# Patient Record
Sex: Male | Born: 2000 | Race: Black or African American | Hispanic: No | Marital: Single | Smoking: Never smoker
Health system: Southern US, Community
[De-identification: ages and names within clinical notes are randomized; demographics above are authoritative.]

## PROBLEM LIST (undated history)

## (undated) HISTORY — PX: TYMPANOSTOMY TUBE PLACEMENT: SHX32

---
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
## 2023-07-08 ENCOUNTER — Ambulatory Visit: Payer: Medicaid Other | Admitting: Family Medicine

## 2023-09-16 ENCOUNTER — Ambulatory Visit: Payer: Self-pay

## 2023-09-17 ENCOUNTER — Ambulatory Visit
Admission: RE | Admit: 2023-09-17 | Discharge: 2023-09-17 | Disposition: A | Discharge status: Home / Self Care | Source: Ambulatory Visit | Attending: Nurse Practitioner

## 2023-09-17 VITALS — BP 130/83 | HR 86 | Temp 97.8°F | Resp 20

## 2023-09-17 DIAGNOSIS — H01001 Unspecified blepharitis right upper eyelid: Secondary | ICD-10-CM

## 2023-09-17 DIAGNOSIS — J302 Other seasonal allergic rhinitis: Secondary | ICD-10-CM

## 2023-09-17 MED ORDER — ERYTHROMYCIN 5 MG/GM OP OINT: TOPICAL_OINTMENT | OPHTHALMIC | 0 refills | Status: AC

## 2023-09-17 MED ORDER — CETIRIZINE HCL 10 MG PO TABS: 10.0000 mg | ORAL_TABLET | Freq: Every day | ORAL | 0 refills | Status: AC

## 2023-09-17 NOTE — ED Provider Notes (Signed)
 [facility]    CSN: [id number] Arrival date & time: 09/17/23  1827      History   Chief Complaint Chief Complaint  Patient presents with   Allergic Reaction    Allergies right eye swollen - Entered by patient   Medication Refill    HPI Ronald Mcguire is a 23 y.o. male.    Brysyn Brandenberger is a 23 year old male presents with a 1-day history of swelling and mild redness of the right upper eyelid. He reports mild soreness in the area but no significant pain. He denies eye redness, vision changes, drainage, or excessive tearing. He has no other current symptoms but does have a history of allergies and experienced runny nose, sneezing, and eye irritation a few weeks ago. He has been taking his prescribed Zyrtec for the past week. His mother reports that he needs a refill of this medication.  The following portions of the patient's history were reviewed and updated as appropriate: allergies, current medications, past family history, past medical history, past social history, past surgical history, and problem list.      History reviewed. No pertinent past medical history.  There are no active problems to display for this patient.   Past Surgical History:  Procedure Laterality Date   TYMPANOSTOMY TUBE PLACEMENT         Home Medications    Prior to Admission medications   Medication Sig Start Date End Date Taking? Authorizing Provider  erythromycin ophthalmic ointment Place a 1/2 inch ribbon of ointment into the lower eyelid every 4 hours while awake for 1 week (7 days) 09/17/23  Yes Lurline Idol, FNP  benzonatate (TESSALON) 100 MG capsule Take 1-2 capsules (100-200 mg total) by mouth 3 (three) times daily as needed for cough. 01/03/21   Wallis Bamberg, PA-C  cetirizine (ZYRTEC) 10 MG tablet Take 1 tablet (10 mg total) by mouth daily with breakfast. 09/17/23   Lurline Idol, FNP  naproxen (NAPROSYN) 375 MG tablet Take 1 tablet (375 mg total) by mouth 2 (two)  times daily with a meal. 01/03/21   Wallis Bamberg, PA-C  Pediatric Multiple Vit-C-FA (MULTIVITAMIN CHILDRENS PO) Take by mouth.    [provider]  promethazine-dextromethorphan (PROMETHAZINE-DM) 6.25-15 MG/5ML syrup Take 5 mLs by mouth at bedtime as needed for cough. 01/03/21   Wallis Bamberg, PA-C    Family History Family History  Problem Relation Age of Onset   Anxiety disorder Mother    ADD / ADHD Sister    Headache Brother    ADD / ADHD Brother    Seizures Other    Depression Neg Hx    Bipolar disorder Neg Hx    Schizophrenia Neg Hx    Autism Neg Hx     Social History Social History   Tobacco Use   Smoking status: Never   Smokeless tobacco: Never  Substance Use Topics   Alcohol use: Never   Drug use: Never     Allergies   Patient has no known allergies.   Review of Systems Review of Systems  Constitutional:  Negative for fever.  HENT:  Negative for congestion, rhinorrhea, sneezing and sore throat.   Eyes:  Negative for photophobia, pain, discharge, redness, itching and visual disturbance.  Gastrointestinal: Negative.   Musculoskeletal: Negative.   Neurological: Negative.   All other systems reviewed and are negative.    Physical Exam Triage Vital Signs ED Triage Vitals  Encounter Vitals Group     BP 09/17/23 1849 130/83  Systolic BP Percentile --      Diastolic BP Percentile --      Pulse Rate 09/17/23 1849 86     Resp 09/17/23 1849 20     Temp 09/17/23 1849 97.8 F (36.6 C)     Temp Source 09/17/23 1849 Oral     SpO2 09/17/23 1849 98 %     Weight --      Height --      Head Circumference --      Peak Flow --      Pain Score 09/17/23 1848 0     Pain Loc --      Pain Education --      Exclude from Growth Chart --    No data found.  Updated Vital Signs BP 130/83 (BP Location: Left Arm)   Pulse 86   Temp 97.8 F (36.6 C) (Oral)   Resp 20   SpO2 98%   Visual Acuity Right Eye Distance:   Left Eye Distance:   Bilateral Distance:     Right Eye Near:   Left Eye Near:    Bilateral Near:     Physical Exam Vitals reviewed.  Constitutional:      General: He is not in acute distress.    Appearance: Normal appearance. He is not toxic-appearing.  HENT:     Head: Normocephalic.     Mouth/Throat:     Mouth: Mucous membranes are moist.  Eyes:     General: Lids are normal. Lids are everted, no foreign bodies appreciated. Vision grossly intact. No visual field deficit.       Right eye: No foreign body, discharge or hordeolum.        Left eye: No foreign body, discharge or hordeolum.     Extraocular Movements: Extraocular movements intact.     Conjunctiva/sclera: Conjunctivae normal.     Pupils: Pupils are equal, round, and reactive to light.     Comments: Right upper eyelid swelling noted   Cardiovascular:     Rate and Rhythm: Normal rate and regular rhythm.     Heart sounds: Normal heart sounds.  Pulmonary:     Effort: Pulmonary effort is normal.     Breath sounds: Normal breath sounds.  Musculoskeletal:        General: Normal range of motion.  Skin:    General: Skin is warm and dry.  Neurological:     General: No focal deficit present.     Mental Status: He is alert and oriented to person, place, and time.      UC Treatments / Results  Labs (all labs ordered are listed, but only abnormal results are displayed) Labs Reviewed - No data to display  EKG   Radiology No results found.  Procedures Procedures (including critical care time)  Medications Ordered in UC Medications - No data to display  Initial Impression / Assessment and Plan / UC Course  I have reviewed the triage vital signs and the nursing notes.  Pertinent labs & imaging results that were available during my care of the patient were reviewed by me and considered in my medical decision making (see chart for details).    23 year old male presenting with right upper eyelid swelling for the past day. He has no current systemic symptoms  but has a history of allergies and recently experienced runny nose, sneezing, and eye irritation. He has been taking his prescribed Zyrtec for allergy management. Patient is alert, oriented, afebrile, and nontoxic. Physical exam is consistent  with blepharitis. Erythromycin ophthalmic ointment was prescribed. Supportive care measures were reviewed, and a refill of Zyrtec was provided.  Today's evaluation has revealed no signs of a dangerous process. Discussed diagnosis with patient and/or guardian. Patient and/or guardian aware of their diagnosis, possible red flag symptoms to watch out for and need for close follow up. Patient and/or guardian understands verbal and written discharge instructions. Patient and/or guardian comfortable with plan and disposition.  Patient and/or guardian has a clear mental status at this time, good insight into illness (after discussion and teaching) and has clear judgment to make decisions regarding their care  Documentation was completed with the aid of voice recognition software. Transcription may contain typographical errors. Final Clinical Impressions(s) / UC Diagnoses   Final diagnoses:  Blepharitis of right upper eyelid, unspecified type  Seasonal allergies     Discharge Instructions      You have been seen today for a condition called blepharitis, which is swelling and irritation of the eyelids. This condition can be uncomfortable but is treatable with regular care. Start by washing your eyes twice a day using a mixture of warm water and a small amount of baby shampoo. Make sure to wash your hands before touching your eyes, and use a clean towel to gently dry your eyelids afterward. Use the prescribed eye ointment as directed. To apply it, gently pull down your lower eyelid to create a small pocket, then squeeze a thin strip of ointment into that space without letting the tube touch your eye. Blink slowly a few times to help spread the medicine. It's normal for  your vision to be blurry for a short time after applying the ointment. Avoid rubbing your eyes, and do not wear makeup until your symptoms have fully resolved. You can also use a warm compress on your eyes for 5 to 10 minutes at a time, several times a day. This can help reduce discomfort and loosen any crusting. Be sure to do the warm compress before applying the ointment, or wait at least two hours after applying it. If you don't see improvement in your symptoms, follow up with your eye doctor or primary care provider for further evaluation.     ED Prescriptions     Medication Sig Dispense Auth. Provider   cetirizine (ZYRTEC) 10 MG tablet Take 1 tablet (10 mg total) by mouth daily with breakfast. 30 tablet Nerine Pulse, FNP   erythromycin ophthalmic ointment Place a 1/2 inch ribbon of ointment into the lower eyelid every 4 hours while awake for 1 week (7 days) 3.5 g Maryruth Sol, FNP      PDMP not reviewed this encounter.   Maryruth Sol, [place] 09/17/23 [id number]

## 2023-09-17 NOTE — ED Triage Notes (Signed)
 Pt presents with bilateral eye swelling, itchy nose and sneezing for a weeks. Pt has taken Zyrtec. Mom is requesting a refill on allergy medication.

## 2023-09-17 NOTE — Discharge Instructions (Addendum)
 You have been seen today for a condition called blepharitis, which is swelling and irritation of the eyelids. This condition can be uncomfortable but is treatable with regular care. Start by washing your eyes twice a day using a mixture of warm water and a small amount of baby shampoo. Make sure to wash your hands before touching your eyes, and use a clean towel to gently dry your eyelids afterward. Use the prescribed eye ointment as directed. To apply it, gently pull down your lower eyelid to create a small pocket, then squeeze a thin strip of ointment into that space without letting the tube touch your eye. Blink slowly a few times to help spread the medicine. It's normal for your vision to be blurry for a short time after applying the ointment. Avoid rubbing your eyes, and do not wear makeup until your symptoms have fully resolved. You can also use a warm compress on your eyes for 5 to 10 minutes at a time, several times a day. This can help reduce discomfort and loosen any crusting. Be sure to do the warm compress before applying the ointment, or wait at least two hours after applying it. If you don't see improvement in your symptoms, follow up with your eye doctor or primary care provider for further evaluation.
